# Patient Record
Sex: Female | Born: 1970 | Race: Black or African American | Hispanic: No | Marital: Single | Smoking: Never smoker
Health system: Southern US, Community
[De-identification: ages and names within clinical notes are randomized; demographics above are authoritative.]

## PROBLEM LIST (undated history)

## (undated) DIAGNOSIS — N946 Dysmenorrhea, unspecified: Secondary | ICD-10-CM

## (undated) DIAGNOSIS — A599 Trichomoniasis, unspecified: Secondary | ICD-10-CM

## (undated) DIAGNOSIS — N870 Mild cervical dysplasia: Secondary | ICD-10-CM

## (undated) DIAGNOSIS — O009 Unspecified ectopic pregnancy without intrauterine pregnancy: Secondary | ICD-10-CM

## (undated) DIAGNOSIS — A749 Chlamydial infection, unspecified: Secondary | ICD-10-CM

## (undated) DIAGNOSIS — A549 Gonococcal infection, unspecified: Secondary | ICD-10-CM

## (undated) DIAGNOSIS — N83209 Unspecified ovarian cyst, unspecified side: Secondary | ICD-10-CM

## (undated) DIAGNOSIS — B009 Herpesviral infection, unspecified: Secondary | ICD-10-CM

## (undated) HISTORY — DX: Unspecified ectopic pregnancy without intrauterine pregnancy: O00.90

## (undated) HISTORY — DX: Gonococcal infection, unspecified: A54.9

## (undated) HISTORY — PX: WISDOM TOOTH EXTRACTION: SHX21

## (undated) HISTORY — PX: TUBAL LIGATION: SHX77

## (undated) HISTORY — DX: Trichomoniasis, unspecified: A59.9

## (undated) HISTORY — DX: Unspecified ovarian cyst, unspecified side: N83.209

## (undated) HISTORY — DX: Dysmenorrhea, unspecified: N94.6

## (undated) HISTORY — DX: Chlamydial infection, unspecified: A74.9

## (undated) HISTORY — DX: Herpesviral infection, unspecified: B00.9

## (undated) HISTORY — DX: Mild cervical dysplasia: N87.0

---

## 1998-01-28 ENCOUNTER — Ambulatory Visit (HOSPITAL_COMMUNITY): Admission: RE | Admit: 1998-01-28 | Discharge: 1998-01-28 | Payer: Self-pay | Admitting: Internal Medicine

## 1999-06-01 ENCOUNTER — Other Ambulatory Visit: Admission: RE | Admit: 1999-06-01 | Discharge: 1999-06-01 | Payer: Self-pay | Admitting: Obstetrics and Gynecology

## 2000-03-14 ENCOUNTER — Ambulatory Visit (HOSPITAL_COMMUNITY): Admission: RE | Admit: 2000-03-14 | Discharge: 2000-03-14 | Payer: Self-pay | Admitting: Obstetrics & Gynecology

## 2000-03-14 ENCOUNTER — Encounter: Payer: Self-pay | Admitting: Obstetrics & Gynecology

## 2000-05-16 ENCOUNTER — Other Ambulatory Visit: Admission: RE | Admit: 2000-05-16 | Discharge: 2000-05-16 | Payer: Self-pay | Admitting: Obstetrics and Gynecology

## 2001-06-19 ENCOUNTER — Other Ambulatory Visit: Admission: RE | Admit: 2001-06-19 | Discharge: 2001-06-19 | Payer: Self-pay | Admitting: Obstetrics and Gynecology

## 2002-03-27 ENCOUNTER — Other Ambulatory Visit: Admission: RE | Admit: 2002-03-27 | Discharge: 2002-03-27 | Payer: Self-pay | Admitting: Obstetrics and Gynecology

## 2002-06-01 ENCOUNTER — Encounter: Payer: Self-pay | Admitting: Obstetrics and Gynecology

## 2002-06-01 ENCOUNTER — Ambulatory Visit (HOSPITAL_COMMUNITY): Admission: RE | Admit: 2002-06-01 | Discharge: 2002-06-01 | Payer: Self-pay | Admitting: Obstetrics and Gynecology

## 2002-08-01 ENCOUNTER — Encounter: Payer: Self-pay | Admitting: Obstetrics and Gynecology

## 2002-08-01 ENCOUNTER — Ambulatory Visit (HOSPITAL_COMMUNITY): Admission: RE | Admit: 2002-08-01 | Discharge: 2002-08-01 | Payer: Self-pay | Admitting: Obstetrics and Gynecology

## 2002-10-09 ENCOUNTER — Inpatient Hospital Stay (HOSPITAL_COMMUNITY): Admission: AD | Admit: 2002-10-09 | Discharge: 2002-10-11 | Payer: Self-pay | Admitting: Obstetrics and Gynecology

## 2003-01-15 ENCOUNTER — Ambulatory Visit (HOSPITAL_COMMUNITY): Admission: RE | Admit: 2003-01-15 | Discharge: 2003-01-15 | Payer: Self-pay | Admitting: Internal Medicine

## 2003-03-27 ENCOUNTER — Other Ambulatory Visit: Admission: RE | Admit: 2003-03-27 | Discharge: 2003-03-27 | Payer: Self-pay | Admitting: Obstetrics and Gynecology

## 2004-03-26 ENCOUNTER — Other Ambulatory Visit: Admission: RE | Admit: 2004-03-26 | Discharge: 2004-03-26 | Payer: Self-pay | Admitting: Obstetrics and Gynecology

## 2005-04-27 ENCOUNTER — Other Ambulatory Visit: Admission: RE | Admit: 2005-04-27 | Discharge: 2005-04-27 | Payer: Self-pay | Admitting: Obstetrics and Gynecology

## 2006-08-03 ENCOUNTER — Other Ambulatory Visit: Admission: RE | Admit: 2006-08-03 | Discharge: 2006-08-03 | Payer: Self-pay | Admitting: Obstetrics and Gynecology

## 2006-09-02 ENCOUNTER — Ambulatory Visit (HOSPITAL_COMMUNITY): Admission: RE | Admit: 2006-09-02 | Discharge: 2006-09-02 | Payer: Self-pay | Admitting: Obstetrics and Gynecology

## 2007-01-17 ENCOUNTER — Inpatient Hospital Stay (HOSPITAL_COMMUNITY): Admission: AD | Admit: 2007-01-17 | Discharge: 2007-01-19 | Payer: Self-pay | Admitting: Obstetrics and Gynecology

## 2007-01-17 ENCOUNTER — Encounter (INDEPENDENT_AMBULATORY_CARE_PROVIDER_SITE_OTHER): Payer: Self-pay | Admitting: Specialist

## 2010-06-30 ENCOUNTER — Ambulatory Visit (HOSPITAL_COMMUNITY): Admission: RE | Admit: 2010-06-30 | Discharge: 2010-06-30 | Payer: Self-pay | Admitting: Internal Medicine

## 2010-10-26 ENCOUNTER — Emergency Department: Payer: Self-pay | Admitting: Emergency Medicine

## 2010-10-26 ENCOUNTER — Ambulatory Visit (HOSPITAL_COMMUNITY): Admission: AD | Admit: 2010-10-26 | Discharge: 2010-10-27 | Payer: Self-pay | Admitting: Obstetrics and Gynecology

## 2010-10-27 ENCOUNTER — Encounter (INDEPENDENT_AMBULATORY_CARE_PROVIDER_SITE_OTHER): Payer: Self-pay | Admitting: Obstetrics and Gynecology

## 2011-01-17 ENCOUNTER — Encounter: Payer: Self-pay | Admitting: Internal Medicine

## 2011-05-14 NOTE — Discharge Summary (Signed)
NAMEEMMELY, BITTINGER             ACCOUNT NO.:  [id number]   MEDICAL RECORD NO.:  [id number]          PATIENT TYPE:  INP   LOCATION:  9146                          FACILITY:  [facility]   PHYSICIAN:  Hal Morales, M.D.DATE OF BIRTH:  07/13/1971   DATE OF ADMISSION:  01/17/2007  DATE OF DISCHARGE:  01/19/2007                               DISCHARGE SUMMARY   ADMISSION DIAGNOSES:  1. Intrauterine pregnancy at term.  2. Advanced active labor in second stage.  3. 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  4. PENICILLIN ALLERGY.  5. Desires sterilization.   DISCHARGE DIAGNOSES:  1. Multiparity.  2. Desires tubal sterilization.  3. Intrauterine pregnancy at term.  4. Advanced active labor in second stage.  5. 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  6. PENICILLIN ALLERGY.   PROCEDURES:  1. Normal spontaneous vaginal birth.  2. Postpartum tubal ligation.  3. General anesthesia.   HOSPITAL COURSE:  Ms. Donoso is a 40 year old gravida 5, para 1-0-3-1 at  38-4/7 weeks who presented in advanced labor on the morning of January 17, 2007.  She was completely dilated following spontaneous rupture of  membranes at home at 6:30.  She had been taking Valtrex prophylaxis for  HSV-2 lesions.  No recent or current lesions or prodrome.  Her pregnancy  had been remarkable for (1) advanced maternal age, (2) PENICILLIN  ALLERGY, (3) abnormal Pap and cryoablation, (4) history of HSV-2 on  Valtrex prophylaxis, (5) desires sterilization, (6) group B strep  negative.   On admission, the cervix was completely dilated, completely effaced,  with the vertex at a 0 station.  Fetal heart rate was reactive.  Contractions were every 2 to 3 minutes.  The patient then progressed  rapidly to delivery of a viable female by the name of Ladona Ridgel.  Weight 6  pounds 12 ounces, Apgars 9 and 9.  The patient had no lacerations noted.  The infant  was taken to the full-term nursery.  Mother was taken to  recovery in good condition.   The patient did desire a tubal.  She was made n.p.o. after midnight.  On  the morning of January 18, 2007, Dr. Jaymes Graff performed a  postpartum tubal ligation under general anesthesia.  The patient  tolerated the procedure well and was taken to recovery in good  condition.   By postpartum day #2, postoperative day #1, the patient was doing well.  She was up ad lib.  She was tolerating a regular diet.  She was having  minimal pain from her tubal incision.  Her bleeding was within normal  limits.  Her hemoglobin on day #1 was 10.6.  It had not been done prior  to delivery secondary to her advanced state of labor.  White blood cell  count was 12.7, and platelet count was 186.   The patient was deemed to have received full benefit from her hospital  stay and was discharged home.   DISCHARGE INSTRUCTIONS:  Per [facility]  handout.   DISCHARGE MEDICATIONS:  1. Motrin 600 mg p.o. q.6h. p.r.n. pain.  2. Tylox one to two p.o. q.3-4h. p.r.n. pain.   FOLLOW UP:  Discharge followup will occur in 6 weeks at [facility].   CONDITION ON DISCHARGE:  Stable.      Renaldo Reel Emilee Hero, C.N.M.      Hal Morales, M.D.  Electronically Signed    VLL/MEDQ  D:  01/19/2007  T:  01/19/2007  Job:  [id number]

## 2011-05-14 NOTE — H&P (Signed)
NAMEHOLY, BATTENFIELD NO.:  [id number]   MEDICAL RECORD NO.:  [id number]          PATIENT TYPE:  INP   LOCATION:  9170                          [facility]ACILITY:  [facility]   PHYSICIAN:  Osborn Coho, M.D.   DATE OF BIRTH:  1971-06-28   DATE OF ADMISSION:  01/17/2007  DATE OF DISCHARGE:                              HISTORY & PHYSICAL   Yolanda Higgins is a 40 year old gravida 5, para 1-0-3-1 who presents at 77-  4/7 weeks, EDD January 27, 2007.  She presents in advanced active labor,  completely dilated following spontaneous rupture of membranes at home  for clear fluid at 6:30 this morning.  Contractions became regular and  intense following spontaneous rupture of membranes.  She reports  positive fetal movement.  No vaginal bleeding.  She denies any headache,  visual changes or epigastric pain.  She has been taking Valtrex  prophylactically for history of HSV II with no current lesions or  prodrome.  Her pregnancy has been followed by the CNM service at [facility]  and is remarkable for:  1. Advanced maternal age.  2. Penicillin allergy.  3. History of abnormal Pap and cryo.  4. History of HSV II.  5. Group B strep negative.  6. Desires sterilization.   This patient began prenatal care at the office of [facility] on August 30, 2006, at approximately 18 weeks' gestation.  EDC determined by 14-week  ultrasound and confirmed with followup.  Ms. Daleo pregnancy has been  essentially unremarkable.  She underwent amnio at 18 weeks for normal  female.  She has been size equal to dates throughout, normotensive with  no proteinuria.   PRENATAL LAB WORK:  Hemoglobin and hematocrit 10.6 and 31.8, platelets  244,000.  Blood type and Rh O positive, antibody screen negative, sickle  cell trait negative, VDRL nonreactive, rubella immune, hepatitis B  surface antigen negative, HIV nonreactive.  Pap smear within normal  limits.  GC and chlamydia negative.  CF testing negative.  Quad screen  within normal limits at 28 weeks.  One-hour glucose challenge 125,  hemoglobin 10.7, RPR nonreactive.  A 36-week culture of the vaginal  tract was negative for group B strep.   OB HISTORY:  In 1992, the patient had a first trimester elective AB with  no complications.  In 1994, the patient had a first trimester SAB with a  D&C, no complications.  In October 2003, the patient had a spontaneous  vaginal delivery with the birth of a 6 pound 9 ounce female infant named  Joselyn Glassman with no complications.  In 2006, she had a first trimester SAB  with no complications.  This is her fifth and current pregnancy.   MEDICAL HISTORY:  Significant for abnormal Pap and cryosurgery in 1991,  mitral valve prolapse with no regurgitation, no antibiotics, history of  migraines, history of HSV II.   SURGICAL HISTORY:  Wisdom teeth, elective AB in 1992 and D&C in 1994.   FAMILY HISTORY:  Maternal grandmother with a history of chronic  hypertension.  The patient's mother with a history of thyroid disease.  Paternal grandmother seizure disorder.  Paternal aunt with breast  cancer.  Paternal grandmother depression.   GENETIC HISTORY:  The patient's mother with a history of sickle cell  trait.  Otherwise there is no family history of children that died with  any birth defects or any that died in infancy.   The patient has allergies to PENICILLIN AND SUDAFED.  She denies the use  of tobacco, alcohol or illicit drugs with her pregnancy.  Her height is  5 feet 4 inches.  Pre gravid weight is 117.  Last recorded prenatal  weight 148.   SOCIAL HISTORY:  Ms. Sciarra is a single African-American female.  Her  partner Grace Bushy is involved and supportive.  They are Methodist in  their faith.   REVIEW OF SYSTEMS:  As described above.  The patient is typical of one  with a uterine pregnancy at term in advanced active labor.   PHYSICAL EXAM:  VITAL SIGNS:  Stable.  The patient is afebrile.  HEENT:  Unremarkable.   HEART:  Regular rate and rhythm.  LUNGS:  Clear.  ABDOMEN:  Gravid in its contour.  Uterine fundus is noted to extend 38  cm above the level of the pubic symphysis.  Leopold's maneuvers finds  the infant to be in longitudinal lie, cephalic presentation and the  estimated fetal weight is 7 pounds.  Baseline of fetal heart rate  monitor is 140s with average long-term variability.  Accelerations and  fetal heart rate are noted with no decelerations.  PELVIC:  The patient is contracting every 2-3 minutes.  Digital exam of  her cervix finds it to be completely dilated, completely effaced with  the cephalic presenting part at a zero station.  Spontaneous rupture of  membranes had occurred, clear fluid is noted.  No HSV lesions are noted.  EXTREMITIES:  Show no pathologic edema.  DTRs are 1+ with no clonus.  There is no calf tenderness noted bilaterally.   ASSESSMENT:  1. Intrauterine pregnancy at term.  2. Advanced active labor second stage.   PLAN:  Admit per Dr. Osborn Coho.  Anticipate spontaneous vaginal  delivery.      Rica Koyanagi, C.N.M.      Osborn Coho, M.D.  Electronically Signed    SDM/MEDQ  D:  01/17/2007  T:  01/17/2007  Job:  [id number]

## 2011-05-14 NOTE — Op Note (Signed)
Yolanda Higgins, Yolanda Higgins             ACCOUNT NO.:  [id number]   MEDICAL RECORD NO.:  [id number]          PATIENT TYPE:  INP   LOCATION:  9146                          [facility]ACILITY:  [facility]   PHYSICIAN:  Naima A. Dillard, M.D. DATE OF BIRTH:  25-Nov-1971   DATE OF PROCEDURE:  01/18/2007  DATE OF DISCHARGE:                               OPERATIVE REPORT   PREOPERATIVE DIAGNOSES:  Multiparity, desires tubal ligation.   POSTOPERATIVE DIAGNOSES:  Multiparity, desires tubal ligation.   PROCEDURE:  Postpartum tubal ligation.   SURGEON:  Naima A. Normand Sloop, M.D.   ANESTHESIA:  General.   COMPLICATIONS:  None.   FINDINGS:  Normal abdominal anatomy, normal appearing bowel, normal  appearing tubes bilaterally. A portion of right and left tube sent to  pathology for evaluation.   ESTIMATED BLOOD LOSS:  Minimal.   DESCRIPTION OF PROCEDURE:  Before the procedure, the patient understood  the risks to be but not limited to bleeding, infection, damage to  internal organs such as bowel, bladder, major blood vessels and a  failure rate of the tubal ligation about 1/200 to 1/300 with half of  those resulting in ectopic pregnancy. The patient understood and decided  to proceed. She was taken to the operating room, given general  anesthesia, prepped and draped in a normal sterile fashion. The bladder  was drained. 5 mL of 0.25% Marcaine was placed in the infraumbilical  area. About a 1 cm infraumbilical incision was made with the scalpel and  carried down to the fascia. The fascia was incised in the midline and  extended bilaterally. The peritoneum was identified, tented up and  entered up sharply. The patient's right fallopian tube was grasped with  a Babcock followed out to the fimbriated end and about a centimeter of  the mid isthmic portion of the tube was ligated with 2-0 plain and  excised. Hemostasis was noted. The patient's left fallopian tube was  grasped with Babcock clamps and followed out to the  fimbriated end.  About a centimeter of the mid isthmic portion of the tube was ligated  with 2-0 plain and excised. Hemostasis was noted. The tubes were  returned back to the abdomen. The fascia was repaired with #0 Vicryl.  The skin was repaired with 3-0 Monocryl in a subcuticular fashion.  Sponge, lap and needle counts were correct. The patient went to the  recovery room in stable condition.      Naima A. Normand Sloop, M.D.  Electronically Signed    NAD/MEDQ  D:  01/18/2007  T:  01/18/2007  Job:  [id number]

## 2011-05-14 NOTE — H&P (Signed)
NAME:  Yolanda Higgins, Yolanda Higgins                       ACCOUNT NO.:  [id number]   MEDICAL RECORD NO.:  [id number]                   PATIENT TYPE:  INP   LOCATION:  9171                                 [facility]ILITY[facility]   PHYSICIAN:  Yolanda Higgins, M.D.             DATE OF BIRTH:  1971-02-12   DATE OF ADMISSION:  10/09/2002  DATE OF DISCHARGE:                                HISTORY & PHYSICAL   HISTORY OF PRESENT ILLNESS:  The patient is a 40 year old gravida 3, para 0-  0-2-0 at 70 and 1/7 weeks, EDD October 29, 2002 by early pregnancy  ultrasonography at 9 weeks 1 day.  Follow-up ultrasounds have been  consistent with growth and development.  On ultrasound at 18 weeks 3 days  patient was noted to have a low lying placenta and a possible vaso previa.  Doppler studies were obtained at [facility] which showed  a low lying placenta with inferior margin approximately 7 mm from the  internal os and no evidence of vaso previa.  On follow-up ultrasound  placenta had entirely cleared from the cervical os.  The patient was noted  at 27 weeks to have a dynamic cervix from 1.2 mm to 2.0 cm.  She has  remained on bed rest.  Follow-up ultrasounds at 30 weeks and at 34 weeks  found cervix to be stable at 2.85 cm.  Again, growth has been normal.  Fluid  has remained normal.  The patient remained on bed rest until 36 weeks of  pregnancy.  She was not on any tocolytic.  At 36 weeks her activities were  returned to normal and she presents today at 37 weeks in labor.  Her  pregnancy has been followed by the M.D. service at [facility] and is remarkable  for questionable LMP, irregular cycles, history of abnormal Pap and cryo,  history of HSV.  She is group B Strep negative.  Her pregnancy was initially  evaluated at the office of [facility] on March 27, 2002 at [redacted] weeks gestation.  Pregnancy is as described above.  She has remained normotensive with no  proteinuria throughout her pregnancy.  Prenatal  laboratory work on March 27, 2002:  Hemoglobin and hematocrit 12.1 and 35.1, platelets 240,000.  Blood  type and Rh O+.  Antibody screen negative.  Sickle cell trait negative.  VDRL reactive ATPPA and negative.  Rubella immune.  Hepatitis C surface  antigen negative.  HIV nonreactive.  Pap smear within normal limits.  GC and  Chlamydia negative.  AFP/free beta hCG within normal range.  At 28 weeks one  hour glucose challenge 104 and hemoglobin 11.1.  At 36 weeks culture of the  vaginal tract is negative for group B Strep.   OB HISTORY:  In 1992 elective AB with no complications.  In 1994 spontaneous  AB with D&C and no complications.  The present pregnancy.   PAST MEDICAL HISTORY:  The patient does have a history of HSV.  She has not  experienced any outbreaks throughout this pregnancy.  She does not have any  prodrome and does not notice any lesions.  She began Valtrex 500 mg p.o.  q.d. on October 02, 2002 and sterile speculum examination today finds no  external and no internal HSV lesions.  She has a history of abnormal Pap and  cryo.  She has a history of mitral valve prolapse with no prophylactic  antibiotics.  She fractured her left elbow as a teenager and had wisdom  teeth removed in 1993.  She does have a history of migraine headaches.   FAMILY HISTORY:  The patient's mother with a history of thyroid disease.  Maternal grandmother with history of chronic hypertension.  Paternal aunt  with breast cancer.  Paternal grandmother CVA.  Paternal grandmother with a  history of depression.   GENETIC HISTORY:  The patient's niece with a heart murmur.  The patient's  mother is a twin.  Father of infant's mother had twins which died.  The  patient's mother has a history of sickle cell trait.   SOCIAL HISTORY:  The patient is a single African-American female.  The  father of the baby, Lake Bells, is involved and supportive.  The  patient's mother is with the patient at the present time  supporting her.  She denies the use of tobacco, alcohol, or illicit drugs.   ALLERGIES:  SUDAFED which caused her to faint.  Otherwise, there are no  known drug allergies.   REVIEW OF SYMPTOMS:  As described above.  The patient is typical of one with  a uterine pregnancy at term in early active labor.   PHYSICAL EXAMINATION:  VITAL SIGNS:  Stable, afebrile.  HEENT:  Unremarkable.  HEART:  Regular rate and rhythm.  LUNGS:  Clear.  ABDOMEN:  Gravid in its contour.  Uterine fundus is noted to extend 37 cm  above the level of the pubic symphysis.  Leopold's maneuvers finds the  infant to be in a longitudinal lie, cephalic presentation.  The estimated  fetal weight is 6.5 pounds.  Fetal heart rate is reassuring by Doppler.  The  patient is contracting regularly.  PELVIC:  Sterile speculum examination finds no external and no internal HSV  lesions.  Digital examination of the cervix finds it to be 5 cm dilated, 90%  effaced with the cephalic presenting part at a -1 station and a bulging bag  of water.  EXTREMITIES:  The patient has no pathologic edema.  DTRs are 1+ with no  clonus.   ASSESSMENT:  Intrauterine pregnancy at term in early active labor.   PLAN:  Admit per Dr. Dierdre Forth.  Routine M.D. orders.     Rica Koyanagi, C.N.M.               Yolanda Higgins, M.D.    SDM/MEDQ  D:  10/09/2002  T:  10/09/2002  Job:  [id number]

## 2011-09-29 ENCOUNTER — Other Ambulatory Visit: Payer: Self-pay | Admitting: Obstetrics and Gynecology

## 2011-09-29 DIAGNOSIS — Z1231 Encounter for screening mammogram for malignant neoplasm of breast: Secondary | ICD-10-CM

## 2011-10-14 ENCOUNTER — Ambulatory Visit
Admission: RE | Admit: 2011-10-14 | Discharge: 2011-10-14 | Disposition: A | Discharge status: Home / Self Care | Payer: 59 | Source: Ambulatory Visit | Attending: Obstetrics and Gynecology | Admitting: Obstetrics and Gynecology

## 2011-10-14 DIAGNOSIS — Z1231 Encounter for screening mammogram for malignant neoplasm of breast: Secondary | ICD-10-CM

## 2012-09-26 ENCOUNTER — Ambulatory Visit (INDEPENDENT_AMBULATORY_CARE_PROVIDER_SITE_OTHER): Payer: 59 | Admitting: Obstetrics and Gynecology

## 2012-09-26 ENCOUNTER — Encounter: Payer: Self-pay | Admitting: Obstetrics and Gynecology

## 2012-09-26 VITALS — BP 102/62 | Ht 64.0 in | Wt 120.0 lb

## 2012-09-26 DIAGNOSIS — Z309 Encounter for contraceptive management, unspecified: Secondary | ICD-10-CM

## 2012-09-26 DIAGNOSIS — Z8759 Personal history of other complications of pregnancy, childbirth and the puerperium: Secondary | ICD-10-CM

## 2012-09-26 DIAGNOSIS — Z124 Encounter for screening for malignant neoplasm of cervix: Secondary | ICD-10-CM

## 2012-09-26 DIAGNOSIS — Z8742 Personal history of other diseases of the female genital tract: Secondary | ICD-10-CM

## 2012-09-26 LAB — RPR

## 2012-09-26 MED ORDER — VALACYCLOVIR HCL 500 MG PO TABS: 500.0000 mg | ORAL_TABLET | Freq: Two times a day (BID) | ORAL | Status: AC

## 2012-09-26 NOTE — Progress Notes (Signed)
AEX  Last Pap: 09/29/2011 WNL: Yes NO hx high grade lesions Regular Periods:yes Contraception: BTL  Monthly Breast exam:yes Tetanus<60yrs:no Nl.Bladder Function:yes Daily BMs:yes Healthy Diet:yes Calcium:no Mammogram:yes Date of Mammogram: 10/14/2011 Exercise:yes Have often Exercise: 1 times weekly Seatbelt: yes Abuse at home: no Stressful work:no Sigmoid-colonoscopy: N/A Bone Density: No PCP: Dr. Chestine Spore Change in PMH: None Change in [facility]: None  Subjective:    Yolanda Higgins is a 41 y.o. female, G5P2, who presents for an annual exam.     History   Social History  . Marital Status: Single    Spouse Name: N/A    Number of Children: N/A  . Years of Education: N/A   Social History Main Topics  . Smoking status: Never Smoker   . Smokeless tobacco: Never Used  . Alcohol Use: No  . Drug Use: No  . Sexually Active: Yes    Birth Control/ Protection: Surgical     BTL   Other Topics Concern  . None   Social History Narrative  . None    Menstrual cycle:   LMP: Patient's last menstrual period was 09/21/2012.           Cycle: regular for 5 days.  No heavy bleeding.  Rare cramps.  No IM bleeding  The following portions of the patient's history were reviewed and updated as appropriate: allergies, current medications, past family history, past medical history, past social history, past surgical history and problem list.  Review of Systems Pertinent items are noted in HPI. Breast:Negative for breast lump,nipple discharge or nipple retraction Gastrointestinal: Negative for abdominal pain, change in bowel habits or rectal bleeding Urinary:negative   Objective:    BP 102/62  Ht 5\' 4"  (1.626 m)  Wt 120 lb (54.432 kg)  BMI 20.60 kg/m2  LMP 09/21/2012    Weight:  Wt Readings from Last 1 Encounters:  09/26/12 120 lb (54.432 kg)          BMI: Body mass index is 20.60 kg/(m^2).  General Appearance: Alert, appropriate appearance for age. No acute distress HEENT:  Grossly normal Neck / Thyroid: Supple, no masses, nodes or enlargement Lungs: clear to auscultation bilaterally Back: No CVA tenderness Breast Exam: No masses or nodes.No dimpling, nipple retraction or discharge. Cardiovascular: Regular rate and rhythm. S1, S2, no murmur Gastrointestinal: Soft, non-tender, no masses or organomegaly Pelvic Exam: Vulva and vagina appear normal. Bimanual exam reveals normal uterus and adnexa. Rectovaginal: normal rectal, no masses Lymphatic Exam: Non-palpable nodes in neck, clavicular, axillary, or inguinal regions Skin: no rash or abnormalities Neurologic: Normal gait and speech, no tremor  Psychiatric: Alert and oriented, appropriate affect.   Wet Prep:not applicable Urinalysis:not applicable UPT: Not done   Assessment:    Normal gyn exam  Hx ectopic  Hx HSV II with rare outbreaks Plan:    mammogram pap smear return annually or prn STD screening: done, GC, chl, HIV, RPR all per pt request  Contraception:bilateral tubal ligation      Dierdre Forth MD

## 2012-09-27 ENCOUNTER — Other Ambulatory Visit: Payer: Self-pay | Admitting: Obstetrics and Gynecology

## 2012-09-27 DIAGNOSIS — Z1231 Encounter for screening mammogram for malignant neoplasm of breast: Secondary | ICD-10-CM

## 2012-09-28 LAB — PAP IG, CT-NG NAA, HPV HIGH-RISK

## 2012-09-29 ENCOUNTER — Encounter: Payer: Self-pay | Admitting: Obstetrics and Gynecology

## 2012-10-16 ENCOUNTER — Ambulatory Visit: Payer: 59

## 2012-11-16 ENCOUNTER — Ambulatory Visit
Admission: RE | Admit: 2012-11-16 | Discharge: 2012-11-16 | Disposition: A | Discharge status: Home / Self Care | Payer: 59 | Source: Ambulatory Visit | Attending: Obstetrics and Gynecology | Admitting: Obstetrics and Gynecology

## 2012-11-16 DIAGNOSIS — Z1231 Encounter for screening mammogram for malignant neoplasm of breast: Secondary | ICD-10-CM

## 2012-12-01 ENCOUNTER — Emergency Department (HOSPITAL_COMMUNITY)
Admission: EM | Admit: 2012-12-01 | Discharge: 2012-12-01 | Disposition: A | Discharge status: Home / Self Care | Payer: 59 | Source: Home / Self Care | Attending: Family Medicine | Admitting: Family Medicine

## 2012-12-01 ENCOUNTER — Encounter (HOSPITAL_COMMUNITY): Payer: Self-pay | Admitting: Emergency Medicine

## 2012-12-01 DIAGNOSIS — J069 Acute upper respiratory infection, unspecified: Secondary | ICD-10-CM

## 2012-12-01 LAB — POCT RAPID STREP A: Streptococcus, Group A Screen (Direct): NEGATIVE

## 2012-12-01 MED ORDER — AZITHROMYCIN 250 MG PO TABS: ORAL_TABLET | ORAL | Status: DC

## 2012-12-01 MED ORDER — IPRATROPIUM BROMIDE 0.06 % NA SOLN: 2.0000 | Freq: Four times a day (QID) | NASAL | Status: DC

## 2012-12-01 MED ORDER — DEXTROMETHORPHAN POLISTIREX 30 MG/5ML PO LQCR: 60.0000 mg | ORAL | Status: DC | PRN

## 2012-12-01 NOTE — ED Provider Notes (Signed)
History     CSN: [id number]  Arrival date & time 12/01/12  4098   First MD Initiated Contact with Patient 12/01/12 1019      Chief Complaint  Patient presents with  . URI    (Consider location/radiation/quality/duration/timing/severity/associated sxs/prior treatment) Patient is a 41 y.o. female presenting with URI. The history is provided by the patient.  URI The primary symptoms include sore throat and cough. Primary symptoms do not include fever, nausea or vomiting. The current episode started 2 days ago. This is a new problem. The problem has not changed since onset. Symptoms associated with the illness include congestion and rhinorrhea.    Past Medical History  Diagnosis Date  . Ovarian cyst   . Ectopic pregnancy   . HSV-2 infection   . Dysmenorrhea   . Gonorrhea   . Chlamydia   . Trichimoniasis   . Dysplasia of cervix, low grade (CIN 1)     Past Surgical History  Procedure Date  . Tubal ligation   . Wisdom tooth extraction     Family History  Problem Relation Age of Onset  . Cancer Mother   . Hypertension Father   . Stroke Father     History  Substance Use Topics  . Smoking status: Never Smoker   . Smokeless tobacco: Never Used  . Alcohol Use: No    OB History    Grav Para Term Preterm Abortions TAB SAB Ect Mult Living   5 2        2       Review of Systems  Constitutional: Negative.  Negative for fever.  HENT: Positive for congestion, sore throat and rhinorrhea.   Respiratory: Positive for cough.   Gastrointestinal: Negative.  Negative for nausea and vomiting.  Genitourinary: Negative.     Allergies  Ciprofloxacin; Penicillins; and Sudafed  Home Medications   Current Outpatient Rx  Name  Route  Sig  Dispense  Refill  . AZITHROMYCIN 250 MG PO TABS      Take as directed on pack   6 each   0   . CALCIUM CARBONATE ANTACID 500 MG PO CHEW   Oral   Chew 1 tablet by mouth daily.         Marland Kitchen DEXTROMETHORPHAN POLISTIREX ER 30 MG/5ML PO  LQCR   Oral   Take 10 mLs (60 mg total) by mouth as needed for cough.   89 mL   0   . IPRATROPIUM BROMIDE 0.06 % NA SOLN   Nasal   Place 2 sprays into the nose 4 (four) times daily.   15 mL   1   . VALACYCLOVIR HCL 500 MG PO TABS   Oral   Take 1 tablet (500 mg total) by mouth 2 (two) times daily.   30 tablet   11     BP 124/66  Pulse 89  Temp 99 F (37.2 C) (Oral)  Resp 16  SpO2 98%  LMP 11/16/2012  Physical Exam  Nursing note and vitals reviewed. Constitutional: She is oriented to person, place, and time. She appears well-developed and well-nourished.  HENT:  Head: Normocephalic.  Right Ear: External ear normal.  Left Ear: External ear normal.  Mouth/Throat: Oropharynx is clear and moist.  Eyes: Pupils are equal, round, and reactive to light.  Neck: Normal range of motion. Neck supple.  Cardiovascular: Normal rate, regular rhythm, normal heart sounds and intact distal pulses.   Pulmonary/Chest: Effort normal and breath sounds normal.  Abdominal: Soft. Bowel sounds are  normal.  Lymphadenopathy:    She has no cervical adenopathy.  Neurological: She is alert and oriented to person, place, and time.  Skin: Skin is warm and dry.    ED Course  Procedures (including critical care time)   Labs Reviewed  POCT RAPID STREP A ([facility] CARE ONLY)   No results found.   1. URI (upper respiratory infection)       MDM          Linna Hoff, MD 12/01/12 1048

## 2012-12-01 NOTE — ED Notes (Signed)
Pt c/o cold sx x2 days... Began w/a sore throat, now sx include: dry cough, nasal/chest congestion, wheezing that began this am... Denies: fevers, vomiting, nauseas, diarrhea... She is alert w/no signs of acute distress.

## 2012-12-06 ENCOUNTER — Encounter: Payer: Self-pay | Admitting: Obstetrics and Gynecology

## 2012-12-06 DIAGNOSIS — R922 Inconclusive mammogram: Secondary | ICD-10-CM | POA: Insufficient documentation

## 2012-12-06 DIAGNOSIS — R9230 Dense breasts, unspecified: Secondary | ICD-10-CM | POA: Insufficient documentation

## 2012-12-07 ENCOUNTER — Encounter: Payer: Self-pay | Admitting: Obstetrics and Gynecology

## 2013-04-12 ENCOUNTER — Other Ambulatory Visit: Payer: Self-pay | Admitting: Internal Medicine

## 2013-04-12 DIAGNOSIS — R200 Anesthesia of skin: Secondary | ICD-10-CM

## 2013-04-16 ENCOUNTER — Ambulatory Visit (HOSPITAL_COMMUNITY)
Admission: RE | Admit: 2013-04-16 | Discharge: 2013-04-16 | Disposition: A | Discharge status: Home / Self Care | Payer: BC Managed Care – PPO | Source: Ambulatory Visit | Attending: Internal Medicine | Admitting: Internal Medicine

## 2013-04-16 DIAGNOSIS — R200 Anesthesia of skin: Secondary | ICD-10-CM

## 2013-04-16 DIAGNOSIS — M51379 Other intervertebral disc degeneration, lumbosacral region without mention of lumbar back pain or lower extremity pain: Secondary | ICD-10-CM | POA: Insufficient documentation

## 2013-04-16 DIAGNOSIS — M5137 Other intervertebral disc degeneration, lumbosacral region: Secondary | ICD-10-CM | POA: Insufficient documentation

## 2014-10-28 ENCOUNTER — Encounter (HOSPITAL_COMMUNITY): Payer: Self-pay | Admitting: Emergency Medicine

## 2015-08-04 ENCOUNTER — Other Ambulatory Visit: Payer: Self-pay

## 2015-08-04 DIAGNOSIS — Z1231 Encounter for screening mammogram for malignant neoplasm of breast: Secondary | ICD-10-CM

## 2015-08-25 ENCOUNTER — Ambulatory Visit
Admission: RE | Admit: 2015-08-25 | Discharge: 2015-08-25 | Disposition: A | Discharge status: Home / Self Care | Payer: BLUE CROSS/BLUE SHIELD | Source: Ambulatory Visit

## 2015-08-25 DIAGNOSIS — Z1231 Encounter for screening mammogram for malignant neoplasm of breast: Secondary | ICD-10-CM

## 2017-07-15 IMAGING — MG MM SCREEN MAMMOGRAM BILATERAL
4 series · 4 of 4 positions shown · non-contrast
Comparison: Previous exam(s).

CLINICAL DATA: Screening.

EXAM:
DIGITAL SCREENING BILATERAL MAMMOGRAM WITH CAD

[R CC]
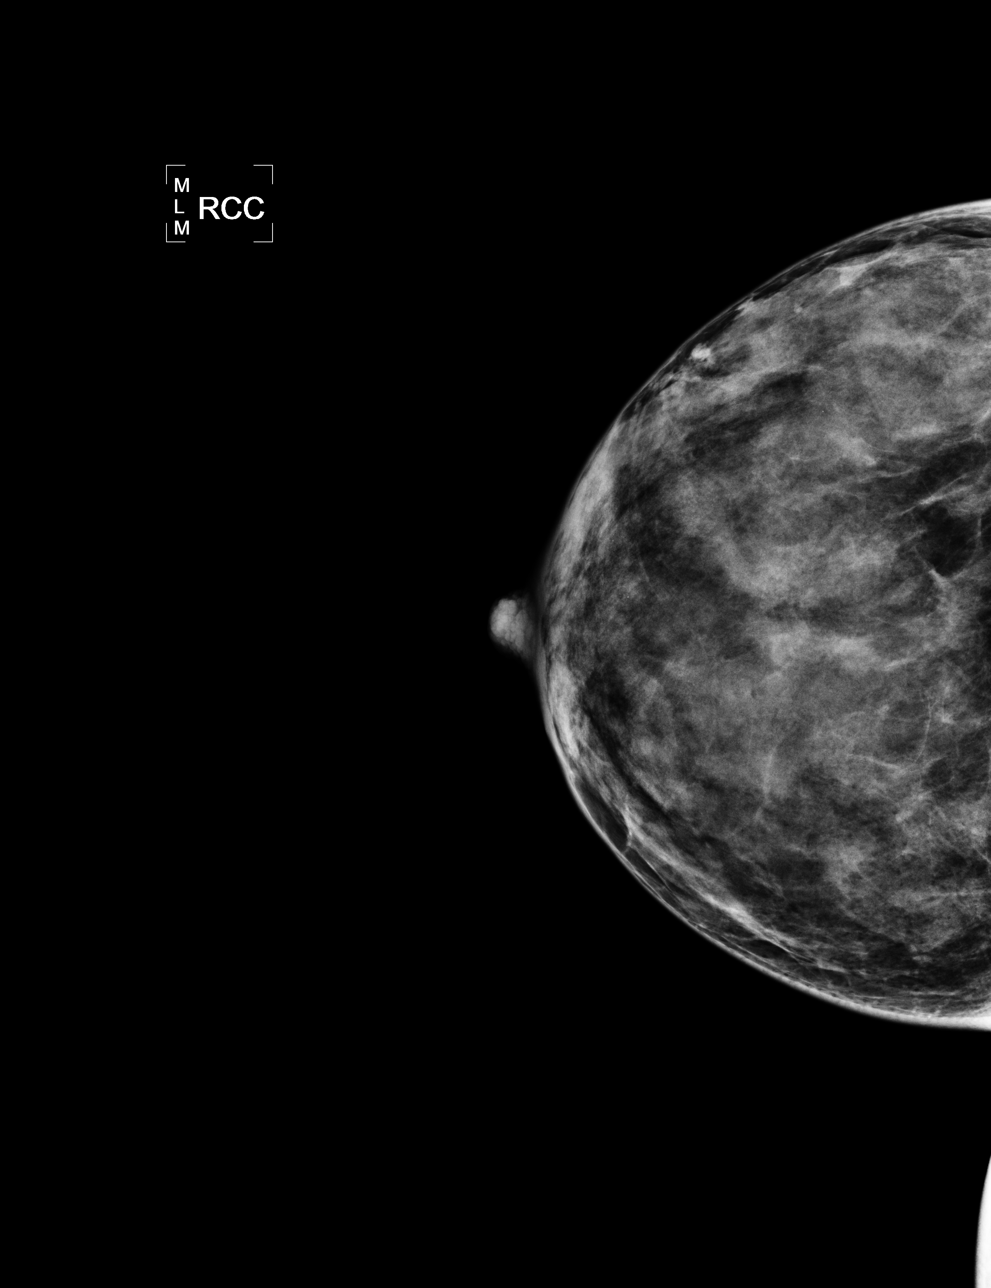

[L CC]
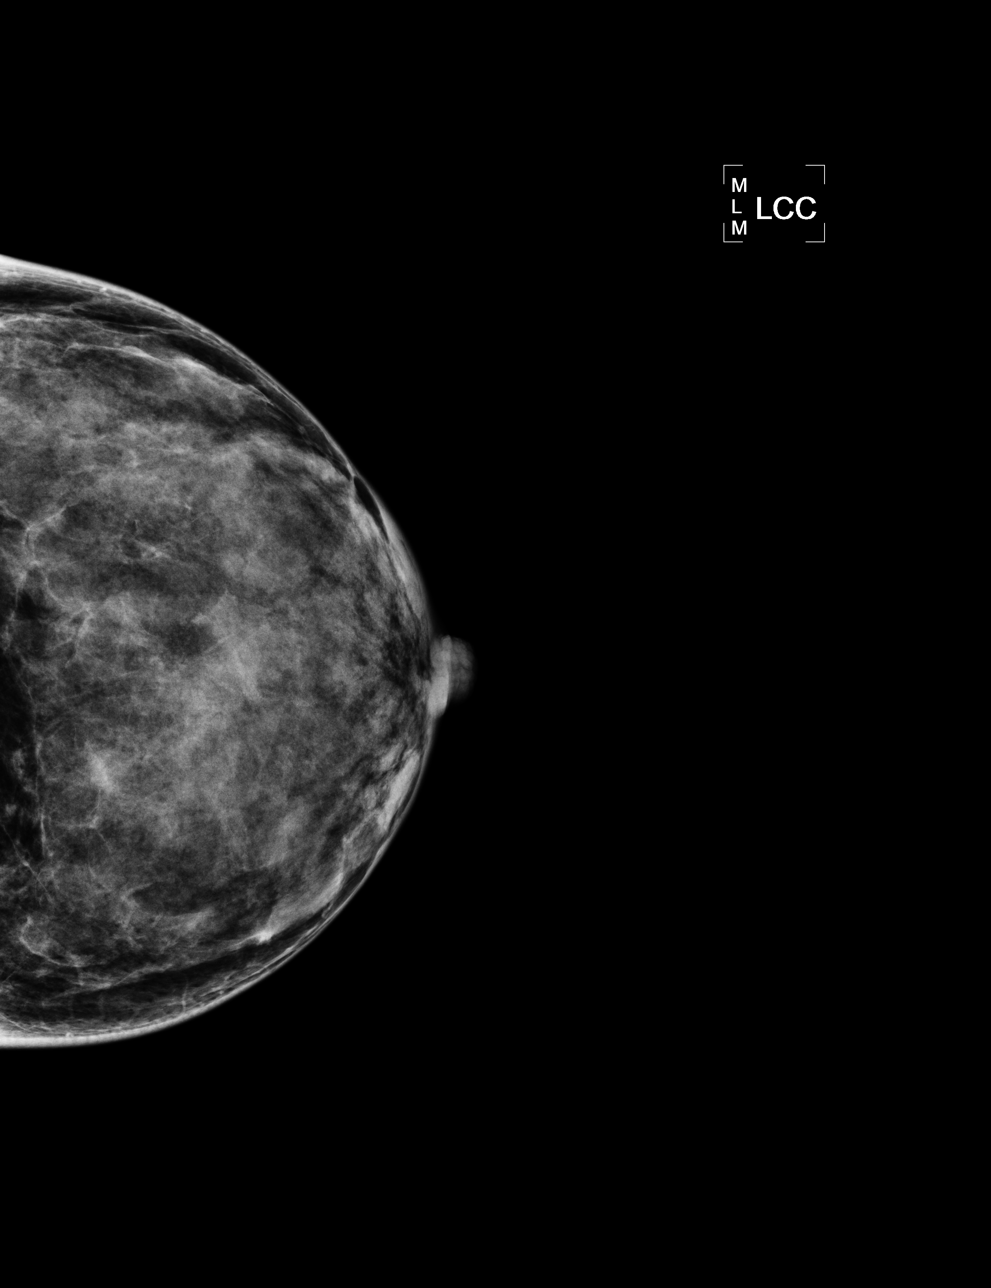

[L MLO]
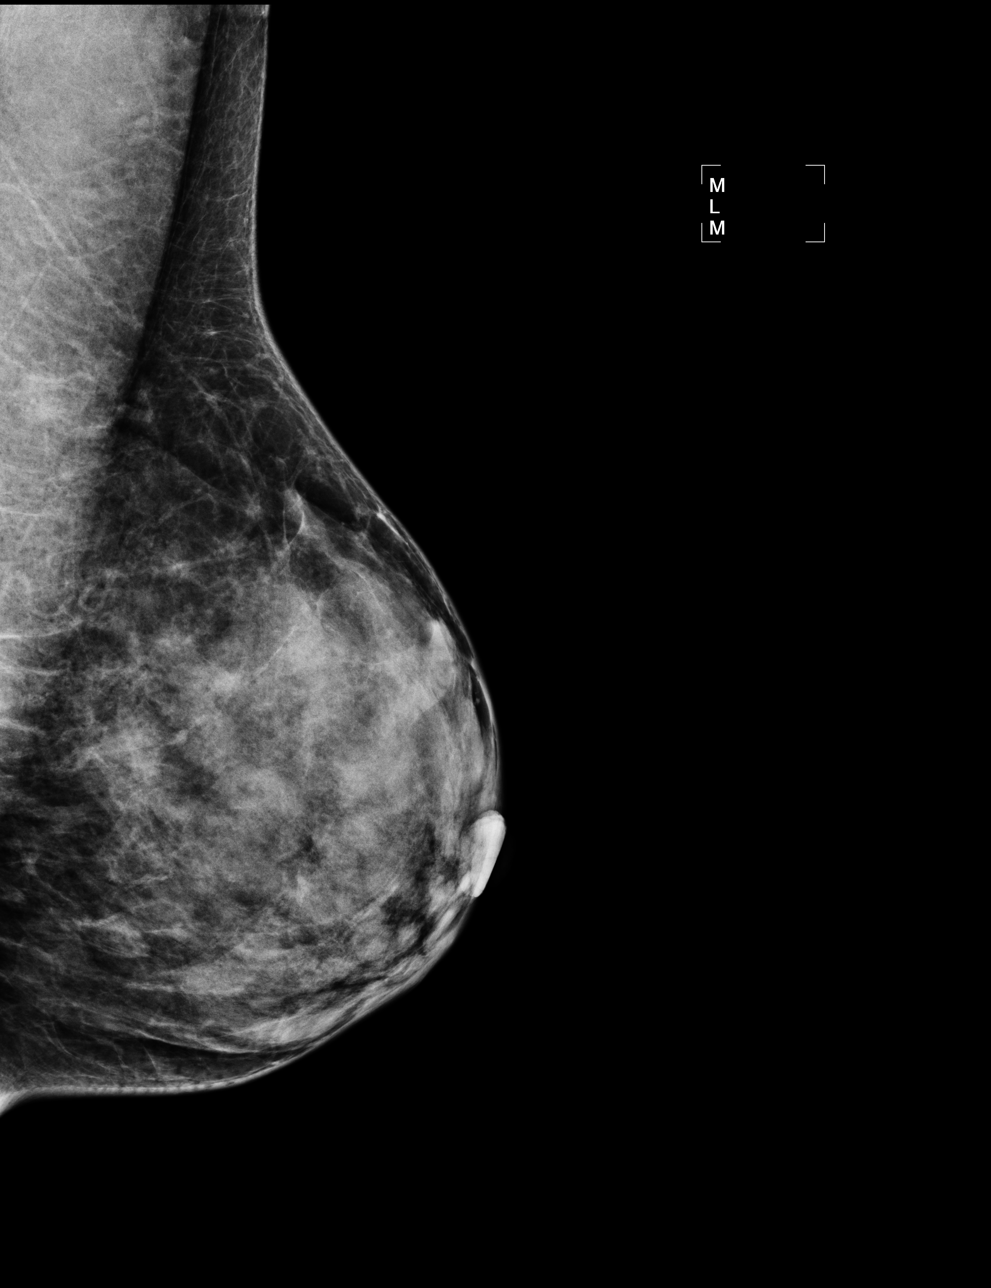

[R MLO]
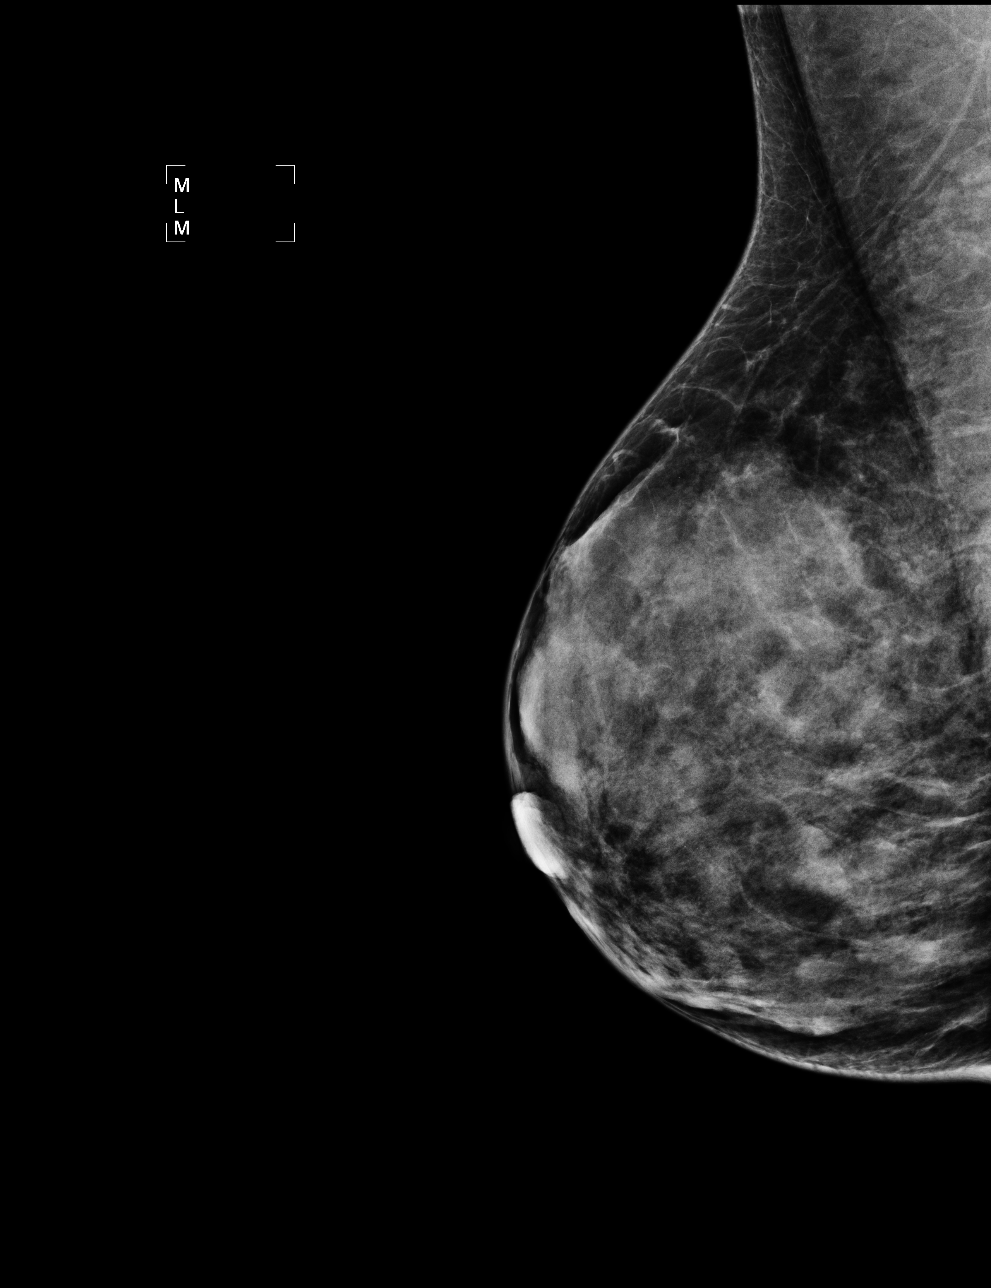

[4 of 4 positions shown; findings below may reference images not displayed]

ACR Breast Density Category d: The breast tissue is extremely dense,
which lowers the sensitivity of mammography.
FINDINGS: There are no findings suspicious for malignancy. Images were
processed with CAD.
IMPRESSION: No mammographic evidence of malignancy. A result letter of this
screening mammogram will be mailed directly to the patient.

RECOMMENDATION:
Screening mammogram in one year. (Code:BD-D-K0F)

BI-RADS CATEGORY  1: Negative.

## 2018-01-18 ENCOUNTER — Ambulatory Visit (HOSPITAL_COMMUNITY)
Admission: EM | Admit: 2018-01-18 | Discharge: 2018-01-18 | Disposition: A | Discharge status: Home / Self Care | Payer: BC Managed Care – PPO | Attending: Family Medicine | Admitting: Family Medicine

## 2018-01-18 ENCOUNTER — Encounter (HOSPITAL_COMMUNITY): Payer: Self-pay | Admitting: Emergency Medicine

## 2018-01-18 DIAGNOSIS — Z87410 Personal history of cervical dysplasia: Secondary | ICD-10-CM | POA: Diagnosis not present

## 2018-01-18 DIAGNOSIS — Z880 Allergy status to penicillin: Secondary | ICD-10-CM | POA: Insufficient documentation

## 2018-01-18 DIAGNOSIS — R8290 Unspecified abnormal findings in urine: Secondary | ICD-10-CM

## 2018-01-18 DIAGNOSIS — Z888 Allergy status to other drugs, medicaments and biological substances status: Secondary | ICD-10-CM | POA: Insufficient documentation

## 2018-01-18 DIAGNOSIS — Z3202 Encounter for pregnancy test, result negative: Secondary | ICD-10-CM

## 2018-01-18 DIAGNOSIS — R3989 Other symptoms and signs involving the genitourinary system: Secondary | ICD-10-CM | POA: Diagnosis present

## 2018-01-18 DIAGNOSIS — Z8619 Personal history of other infectious and parasitic diseases: Secondary | ICD-10-CM | POA: Insufficient documentation

## 2018-01-18 DIAGNOSIS — Z8249 Family history of ischemic heart disease and other diseases of the circulatory system: Secondary | ICD-10-CM | POA: Diagnosis not present

## 2018-01-18 DIAGNOSIS — Z823 Family history of stroke: Secondary | ICD-10-CM | POA: Insufficient documentation

## 2018-01-18 DIAGNOSIS — N12 Tubulo-interstitial nephritis, not specified as acute or chronic: Secondary | ICD-10-CM | POA: Diagnosis not present

## 2018-01-18 DIAGNOSIS — Z881 Allergy status to other antibiotic agents status: Secondary | ICD-10-CM | POA: Insufficient documentation

## 2018-01-18 LAB — POCT PREGNANCY, URINE: PREG TEST UR: NEGATIVE

## 2018-01-18 MED ORDER — STERILE WATER FOR INJECTION IJ SOLN
INTRAMUSCULAR | Status: AC
  Filled 2018-01-18: qty 10

## 2018-01-18 MED ORDER — IBUPROFEN 800 MG PO TABS
800.0000 mg | ORAL_TABLET | Freq: Once | ORAL | Status: AC
  Administered 2018-01-18: 800 mg via ORAL

## 2018-01-18 MED ORDER — CEFTRIAXONE SODIUM 1 G IJ SOLR
1.0000 g | Freq: Once | INTRAMUSCULAR | Status: AC
  Administered 2018-01-18: 1 g via INTRAMUSCULAR

## 2018-01-18 MED ORDER — IBUPROFEN 800 MG PO TABS
ORAL_TABLET | ORAL | Status: AC
  Filled 2018-01-18: qty 1

## 2018-01-18 MED ORDER — LIDOCAINE HCL (PF) 1 % IJ SOLN
INTRAMUSCULAR | Status: AC
  Filled 2018-01-18: qty 2

## 2018-01-18 MED ORDER — CEFTRIAXONE SODIUM 1 G IJ SOLR
INTRAMUSCULAR | Status: AC
  Filled 2018-01-18: qty 10

## 2018-01-18 MED ORDER — SULFAMETHOXAZOLE-TRIMETHOPRIM 800-160 MG PO TABS: 1.0000 | ORAL_TABLET | Freq: Two times a day (BID) | ORAL | 0 refills | Status: AC

## 2018-01-18 NOTE — Discharge Instructions (Signed)
Push fluids, primarily water, empty bladder regularly. Tylenol and/or ibuprofen as needed for pain or fevers.  If worsening of pain, fevers, blood in urine, difficulty urinating, no improvement in the next 48 hours, or otherwise worsening please return to be seen or go to Er.

## 2018-01-18 NOTE — ED Triage Notes (Signed)
PT C/O: UTI sx onset yest associated w/chills, body aches, headaches, foul urine smell and cloudy   DENIES: dysuria, urinary freq/urgency, hematuria, fevers  TAKING MEDS: none   A&O x4... NAD... Ambulatory

## 2018-01-18 NOTE — ED Provider Notes (Signed)
[facility]    CSN: [id number] Arrival date & time: 01/18/18  1157     History   Chief Complaint Chief Complaint  Patient presents with  . Urinary Tract Infection    HPI Yolanda Higgins is a 47 y.o. female.   [name] presents with complaints of body aches, left back pain, chills, cloudy and odorous urine, which started yesterday. States feels similar to a urinary tract infection she has had in the past. Took ibuprofen last night but has not taken since. Denies blood in urine, vaginal discharge or itching. Without URI symptoms. No known ill contacts. History of std's, ectopic pregnancy, ovarian cyst    ROS per HPI.       Past Medical History:  Diagnosis Date  . Chlamydia   . Dysmenorrhea   . Dysplasia of cervix, low grade (CIN 1)   . Ectopic pregnancy   . Gonorrhea   . HSV-2 infection   . Ovarian cyst   . Trichimoniasis     Patient Active Problem List   Diagnosis Date Noted  . Dense breasts 12/06/2012  . Hx of ectopic pregnancy 10/27/2010    Past Surgical History:  Procedure Laterality Date  . TUBAL LIGATION    . WISDOM TOOTH EXTRACTION      OB History    Gravida Para Term Preterm AB Living   5 2       2    SAB TAB Ectopic Multiple Live Births                   Home Medications    Prior to Admission medications   Medication Sig Start Date End Date Taking? Authorizing Provider  sulfamethoxazole-trimethoprim (BACTRIM DS,SEPTRA DS) 800-160 MG tablet Take 1 tablet by mouth 2 (two) times daily for 14 days. 01/18/18 02/01/18  Georgetta HaberBurky, Mayline Dragon B, NP  valACYclovir (VALTREX) 500 MG tablet Take 1 tablet (500 mg total) by mouth 2 (two) times daily. 09/26/12   Haygood, Maris BergerVanessa P, MD    Family History Family History  Problem Relation Age of Onset  . Cancer Mother   . Hypertension Father   . Stroke Father     Social History Social History   Tobacco Use  . Smoking status: Never Smoker  . Smokeless tobacco: Never Used  Substance Use Topics    . Alcohol use: No  . Drug use: No     Allergies   Ciprofloxacin; Penicillins; and Sudafed [pseudoephedrine hcl]   Review of Systems Review of Systems   Physical Exam Triage Vital Signs ED Triage Vitals  Enc Vitals Group     BP 01/18/18 1244 (!) 108/47     Pulse Rate 01/18/18 1244 (!) 113     Resp 01/18/18 1244 16     Temp 01/18/18 1244 (!) 101.2 F (38.4 C)     Temp Source 01/18/18 1244 Oral     SpO2 01/18/18 1244 100 %     Weight --      Height --      Head Circumference --      Peak Flow --      Pain Score 01/18/18 1246 6     Pain Loc --      Pain Edu? --      Excl. in GC? --    No data found.  Updated Vital Signs BP (!) 108/47 (BP Location: Left Arm)   Pulse (!) 113   Temp (!) 101.2 F (38.4 C) (Oral)   Resp 16  LMP 01/11/2018   SpO2 100%   Visual Acuity Right Eye Distance:   Left Eye Distance:   Bilateral Distance:    Right Eye Near:   Left Eye Near:    Bilateral Near:     Physical Exam  Constitutional: She is oriented to person, place, and time. She appears well-developed and well-nourished. No distress.  Cardiovascular: Regular rhythm and normal heart sounds. Tachycardia present.  Pulmonary/Chest: Effort normal and breath sounds normal.  Abdominal: Soft. There is no tenderness. There is CVA tenderness.  Positive left CVA tenderness  Neurological: She is alert and oriented to person, place, and time.  Skin: Skin is warm and dry.   Urine dip manual read:  Large leuks Positive nitrite 100+ protein PH 5.0 Large blood Specific gravity 1.030 Ketones 160+ Bili large Glucose neg   UC Treatments / Results  Labs (all labs ordered are listed, but only abnormal results are displayed) Labs Reviewed  URINE CULTURE  POCT PREGNANCY, URINE  URINE CYTOLOGY ANCILLARY ONLY    EKG  EKG Interpretation None       Radiology No results found.  Procedures Procedures (including critical care time)  Medications Ordered in UC Medications   cefTRIAXone (ROCEPHIN) injection 1 g (1 g Intramuscular Given 01/18/18 1325)  ibuprofen (ADVIL,MOTRIN) tablet 800 mg (800 mg Oral Given 01/18/18 1325)     Initial Impression / Assessment and Plan / UC Course  I have reviewed the triage vital signs and the nursing notes.  Pertinent labs & imaging results that were available during my care of the patient were reviewed by me and considered in my medical decision making (see chart for details).     Urine sent for culture. Fever and tachycardia with cva tenderness, rocephin provided in clinic. Ibuprofen for pain and fevers. Course of bacrim. Patient taking PO without nausea or vomiting, encouraged increased fluid intake. Return precautions provided and emphasized. If symptoms worsen or do not improve in the next week to return to be seen or to follow up with PCP.  Patient verbalized understanding and agreeable to plan.  Felt well, agreeable to discharge and ambulatory out of clinic without difficulty.  Case discussed with supervising physician Dr. Tracie Harrier  Final Clinical Impressions(s) / UC Diagnoses   Final diagnoses:  Pyelonephritis    ED Discharge Orders        Ordered    sulfamethoxazole-trimethoprim (BACTRIM DS,SEPTRA DS) 800-160 MG tablet  2 times daily     01/18/18 1311       Controlled Substance Prescriptions [place] Controlled Substance Registry consulted? Not Applicable   Georgetta Haber, NP 01/18/18 1358    Georgetta Haber, NP 01/18/18 [phone]

## 2018-01-18 NOTE — ED Notes (Signed)
Pt moved to room 10 for 20-30 min of observation after her Rocephin Injection.  Pt was instructed to let us know if she has any problems and we will also be checking on her for any signs of allergic reaction.

## 2018-01-18 NOTE — ED Notes (Signed)
Urinalysis performed, levels exceeded the limits of the clintex analyzer. Reported values of urine strip to Northwest Airlinesatalie Burky.

## 2018-01-19 ENCOUNTER — Telehealth (HOSPITAL_COMMUNITY): Payer: Self-pay | Admitting: Family Medicine

## 2018-01-19 LAB — URINE CYTOLOGY ANCILLARY ONLY
Chlamydia: NEGATIVE
NEISSERIA GONORRHEA: NEGATIVE
TRICH (WINDOWPATH): NEGATIVE

## 2018-01-19 MED ORDER — ONDANSETRON HCL 4 MG PO TABS: 4.0000 mg | ORAL_TABLET | Freq: Three times a day (TID) | ORAL | 0 refills | Status: DC | PRN

## 2018-01-19 MED ORDER — ONDANSETRON HCL 4 MG PO TABS: 4.0000 mg | ORAL_TABLET | Freq: Three times a day (TID) | ORAL | 0 refills | Status: AC | PRN

## 2018-01-19 NOTE — Telephone Encounter (Signed)
Spoke with Dr. Tracie HarrierHagler and Dr. Elbert EwingsL. Not many choices to change abx due to allergy list. Will try Zofran for N,V to take before abx and see if that helps. If not she needs to go to the ER. Pt understands.

## 2018-01-20 LAB — URINE CULTURE: Culture: >=100000 — AB

## 2018-01-23 LAB — URINE CYTOLOGY ANCILLARY ONLY: CANDIDA VAGINITIS: NEGATIVE

## 2019-06-11 ENCOUNTER — Encounter (HOSPITAL_COMMUNITY): Payer: Self-pay

## 2019-06-11 ENCOUNTER — Other Ambulatory Visit: Payer: Self-pay

## 2019-06-11 ENCOUNTER — Ambulatory Visit (HOSPITAL_COMMUNITY)
Admission: EM | Admit: 2019-06-11 | Discharge: 2019-06-11 | Disposition: A | Discharge status: Home / Self Care | Payer: BC Managed Care – PPO | Attending: Family Medicine | Admitting: Family Medicine

## 2019-06-11 DIAGNOSIS — N390 Urinary tract infection, site not specified: Secondary | ICD-10-CM | POA: Diagnosis not present

## 2019-06-11 LAB — POCT URINALYSIS DIP (DEVICE)
Bilirubin Urine: NEGATIVE
Glucose, UA: NEGATIVE mg/dL
Ketones, ur: NEGATIVE mg/dL
Nitrite: POSITIVE — AB
Protein, ur: NEGATIVE mg/dL
Specific Gravity, Urine: 1.025 (ref 1.005–1.030)
Urobilinogen, UA: 0.2 mg/dL (ref 0.0–1.0)
pH: 7 (ref 5.0–8.0)

## 2019-06-11 MED ORDER — ONDANSETRON 4 MG PO TBDP: 4.0000 mg | ORAL_TABLET | Freq: Three times a day (TID) | ORAL | 0 refills | Status: AC | PRN

## 2019-06-11 MED ORDER — CEPHALEXIN 500 MG PO CAPS: 500.0000 mg | ORAL_CAPSULE | Freq: Four times a day (QID) | ORAL | 0 refills | Status: AC

## 2019-06-11 NOTE — Discharge Instructions (Signed)
Return if any problems.

## 2019-06-11 NOTE — ED Triage Notes (Signed)
Patient presents to Urgent Care with complaints of lower back pain and burning with urination since yesterday. Patient reports she has has UTIs in the past and this feels the same.

## 2019-06-11 NOTE — ED Provider Notes (Signed)
[place]    CSN: [id number] Arrival date & time: 06/11/19  6387      History   Chief Complaint Chief Complaint  Patient presents with  . Urinary Tract Infection    HPI Yolanda Higgins is a 48 y.o. female.   The history is provided by the patient. No language interpreter was used.  Urinary Tract Infection Pain quality:  Aching Pain severity:  Moderate Onset quality:  Gradual Duration:  2 days Timing:  Constant Progression:  Worsening Chronicity:  New Recent urinary tract infections: no   Relieved by:  Nothing Worsened by:  Nothing Ineffective treatments:  None tried Urinary symptoms: frequent urination     Past Medical History:  Diagnosis Date  . Chlamydia   . Dysmenorrhea   . Dysplasia of cervix, low grade (CIN 1)   . Ectopic pregnancy   . Gonorrhea   . HSV-2 infection   . Ovarian cyst   . Trichimoniasis     Patient Active Problem List   Diagnosis Date Noted  . Dense breasts 12/06/2012  . Hx of ectopic pregnancy 10/27/2010    Past Surgical History:  Procedure Laterality Date  . TUBAL LIGATION    . WISDOM TOOTH EXTRACTION      OB History    Gravida  5   Para  2   Term      Preterm      AB      Living  2     SAB      TAB      Ectopic      Multiple      Live Births               Home Medications    Prior to Admission medications   Medication Sig Start Date End Date Taking? Authorizing Provider  cephALEXin (KEFLEX) 500 MG capsule Take 1 capsule (500 mg total) by mouth 4 (four) times daily. 06/11/19   Fransico Meadow, PA-C  ondansetron (ZOFRAN ODT) 4 MG disintegrating tablet Take 1 tablet (4 mg total) by mouth every 8 (eight) hours as needed for nausea or vomiting. 06/11/19   Fransico Meadow, PA-C  ondansetron (ZOFRAN) 4 MG tablet Take 1 tablet (4 mg total) by mouth every 8 (eight) hours as needed for nausea or vomiting. 01/19/18   Vanessa Kick, MD  valACYclovir (VALTREX) 500 MG tablet Take 1 tablet (500 mg  total) by mouth 2 (two) times daily. 09/26/12   Haygood, Seymour Bars, MD    Family History Family History  Problem Relation Age of Onset  . Cancer Mother   . Hypertension Father   . Stroke Father     Social History Social History   Tobacco Use  . Smoking status: Never Smoker  . Smokeless tobacco: Never Used  Substance Use Topics  . Alcohol use: No  . Drug use: No     Allergies   Ciprofloxacin, Penicillins, and Sudafed [pseudoephedrine hcl]   Review of Systems Review of Systems  All other systems reviewed and are negative.    Physical Exam Triage Vital Signs ED Triage Vitals  Enc Vitals Group     BP 06/11/19 0950 107/66     Pulse Rate 06/11/19 0950 (!) 113     Resp 06/11/19 0950 18     Temp 06/11/19 0950 (!) 100.5 F (38.1 C)     Temp Source 06/11/19 0950 Oral     SpO2 06/11/19 0950 98 %     Weight --  Height --      Head Circumference --      Peak Flow --      Pain Score 06/11/19 0948 0     Pain Loc --      Pain Edu? --      Excl. in GC? --    No data found.  Updated Vital Signs BP 107/66 (BP Location: Left Arm)   Pulse (!) 113   Temp (!) 100.5 F (38.1 C) (Oral)   Resp 18   SpO2 98%   Visual Acuity Right Eye Distance:   Left Eye Distance:   Bilateral Distance:    Right Eye Near:   Left Eye Near:    Bilateral Near:     Physical Exam Vitals signs and nursing note reviewed.  Constitutional:      Appearance: She is well-developed.  HENT:     Head: Normocephalic.     Nose: Nose normal.     Mouth/Throat:     Mouth: Mucous membranes are moist.  Eyes:     Pupils: Pupils are equal, round, and reactive to light.  Neck:     Musculoskeletal: Normal range of motion.  Cardiovascular:     Rate and Rhythm: Normal rate.  Pulmonary:     Effort: Pulmonary effort is normal.  Abdominal:     General: Abdomen is flat. There is no distension.  Musculoskeletal: Normal range of motion.  Skin:    General: Skin is warm.  Neurological:     Mental  Status: She is alert and oriented to person, place, and time.      UC Treatments / Results  Labs (all labs ordered are listed, but only abnormal results are displayed) Labs Reviewed  POCT URINALYSIS DIP (DEVICE) - Abnormal; Notable for the following components:      Result Value   Hgb urine dipstick MODERATE (*)    Nitrite POSITIVE (*)    Leukocytes,Ua TRACE (*)    All other components within normal limits    EKG None  Radiology No results found.  Procedures Procedures (including critical care time)  Medications Ordered in UC Medications - No data to display  Initial Impression / Assessment and Plan / UC Course  I have reviewed the triage vital signs and the nursing notes.  Pertinent labs & imaging results that were available during my care of the patient were reviewed by me and considered in my medical decision making (see chart for details).     MDM Pt counseled on uti and treatment Final Clinical Impressions(s) / UC Diagnoses   Final diagnoses:  Urinary tract infection with hematuria, site unspecified     Discharge Instructions     Return if any problems.    ED Prescriptions    Medication Sig Dispense Auth. Provider   cephALEXin (KEFLEX) 500 MG capsule Take 1 capsule (500 mg total) by mouth 4 (four) times daily. 40 capsule ,  K, PA-C   ondansetron (ZOFRAN ODT) 4 MG disintegrating tablet Take 1 tablet (4 mg total) by mouth every 8 (eight) hours as needed for nausea or vomiting. 20 tablet Elson Areas,  K, [place]-C     Controlled Substance Prescriptions [place] Controlled Substance Registry consulted? Not Applicable  An After Visit Summary was printed and given to the patient.    Elson Areas,  K, [place] 06/11/19 1435

## 2020-03-03 ENCOUNTER — Encounter: Payer: Self-pay | Admitting: Family Medicine

## 2020-03-03 ENCOUNTER — Ambulatory Visit: Payer: BC Managed Care – PPO | Admitting: Family Medicine

## 2020-03-03 ENCOUNTER — Ambulatory Visit: Payer: Self-pay

## 2020-03-03 ENCOUNTER — Other Ambulatory Visit: Payer: Self-pay

## 2020-03-03 VITALS — BP 104/72 | HR 62 | Ht 64.0 in | Wt 120.0 lb

## 2020-03-03 DIAGNOSIS — G8929 Other chronic pain: Secondary | ICD-10-CM | POA: Diagnosis not present

## 2020-03-03 DIAGNOSIS — G2589 Other specified extrapyramidal and movement disorders: Secondary | ICD-10-CM | POA: Diagnosis not present

## 2020-03-03 DIAGNOSIS — M25542 Pain in joints of left hand: Secondary | ICD-10-CM | POA: Insufficient documentation

## 2020-03-03 DIAGNOSIS — M2550 Pain in unspecified joint: Secondary | ICD-10-CM | POA: Insufficient documentation

## 2020-03-03 DIAGNOSIS — M79645 Pain in left finger(s): Secondary | ICD-10-CM | POA: Diagnosis not present

## 2020-03-03 NOTE — Assessment & Plan Note (Signed)
Left thumb pain.  Discussed with patient about icing regimen, home exercise, which activities to do which was to avoid.  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  Possible even subluxation exam.  Discussed how to accomplish relocation if necessary.  Once patient is fine we will see patient again in 5 to 6 weeks.  Mild strengthening exercises given

## 2020-03-03 NOTE — Patient Instructions (Signed)
Exercises 3x a week See me again in 6 weeks, if ok see me when you need me

## 2020-03-03 NOTE — Assessment & Plan Note (Signed)
Scapular dyskinesis noted.  Discussed with patient posture and ergonomics throughout the day.  Patient will increase activity slowly when possible.  We discussed different ergonomic changes with work.  Follow-up again in 4 to 8 weeks

## 2020-03-03 NOTE — Progress Notes (Signed)
Yolanda Higgins Phone: [phone] Subjective:   Yolanda Higgins, am serving as a scribe for Dr. Hulan Saas. This visit occurred during the SARS-CoV-2 public health emergency.  Safety protocols were in place, including screening questions prior to the visit, additional usage of staff PPE, and extensive cleaning of exam room while observing appropriate contact time as indicated for disinfecting solutions.    I'm seeing this patient by the request  of:  Patient, Higgins Pcp Per  CC: Left thumb pain  [id number]  Yolanda Higgins is a 49 y.o. female coming in with complaint of left thumb pain for 2 weeks over the thenar eminence. Pain has subsided. 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 Does not recall mechanism of injury. Did have tingling into the wrist. Was unable to grip without pain.      Past Medical History:  Diagnosis Date  . Chlamydia   . Dysmenorrhea   . Dysplasia of cervix, low grade (CIN 1)   . Ectopic pregnancy   . Gonorrhea   . HSV-2 infection   . Ovarian cyst   . Trichimoniasis    Past Surgical History:  Procedure Laterality Date  . TUBAL LIGATION    . WISDOM TOOTH EXTRACTION     Social History   Socioeconomic History  . Marital status: Single    Spouse name: Not on file  . Number of children: Not on file  . Years of education: Not on file  . Highest education level: Not on file  Occupational History  . Not on file  Tobacco Use  . Smoking status: Never Smoker  . Smokeless tobacco: Never Used  Substance and Sexual Activity  . Alcohol use: Higgins  . Drug use: Higgins  . Sexual activity: Yes    Birth control/protection: Surgical    Comment: BTL  Other Topics Concern  . Not on file  Social History Narrative  . Not on file   Social Determinants of Health   Financial Resource Strain:   . Difficulty of Paying Living Expenses: Not on file  Food Insecurity:   .  Worried About Charity fundraiser in the Last Year: Not on file  . Ran Out of Food in the Last Year: Not on file  Transportation Needs:   . Lack of Transportation (Medical): Not on file  . Lack of Transportation (Non-Medical): Not on file  Physical Activity:   . Days of Exercise per Week: Not on file  . Minutes of Exercise per Session: Not on file  Stress:   . Feeling of Stress : Not on file  Social Connections:   . Frequency of Communication with Friends and Family: Not on file  . Frequency of Social Gatherings with Friends and Family: Not on file  . Attends Religious Services: Not on file  . Active Member of Clubs or Organizations: Not on file  . Attends Archivist Meetings: Not on file  . Marital Status: Not on file   Allergies  Allergen Reactions  . Ciprofloxacin   . Penicillins     Sts she can take Amox  . Sudafed [Pseudoephedrine Hcl]    Family History  Problem Relation Age of Onset  . Cancer Mother   . Hypertension Father   . Stroke Father          Current Outpatient Medications (Other):  .  cephALEXin (KEFLEX) 500 MG capsule, Take 1 capsule (500  mg total) by mouth 4 (four) times daily. .  ondansetron (ZOFRAN ODT) 4 MG disintegrating tablet, Take 1 tablet (4 mg total) by mouth every 8 (eight) hours as needed for nausea or vomiting. .  ondansetron (ZOFRAN) 4 MG tablet, Take 1 tablet (4 mg total) by mouth every 8 (eight) hours as needed for nausea or vomiting. .  valACYclovir (VALTREX) 500 MG tablet, Take 1 tablet (500 mg total) by mouth 2 (two) times daily.   Reviewed prior external information including notes and imaging from  primary care provider As well as notes that were available from care everywhere and other healthcare systems.  Past medical history, social, surgical and family history all reviewed in electronic medical record.  Higgins pertanent information unless stated regarding to the chief complaint.   Review of Systems:  Higgins headache, visual  changes, nausea, vomiting, diarrhea, constipation, dizziness, abdominal pain, skin rash, fevers, chills, night sweats, weight loss, swollen lymph nodes, body aches, joint swelling, chest pain, shortness of breath, mood changes. POSITIVE muscle aches  Objective  Blood pressure 104/72, pulse 62, height 5\' 4"  (1.626 m), weight 120 lb (54.4 kg), SpO2 99 %.   General: Higgins apparent distress alert and oriented x3 mood and affect normal, dressed appropriately.  HEENT: Pupils equal, extraocular movements intact  Respiratory: Patient's speak in full sentences and does not appear short of breath  Cardiovascular: Higgins lower extremity edema, non tender, Higgins erythema  Skin: Warm dry intact with Higgins signs of infection or rash on extremities or on axial skeleton.  Abdomen: Soft nontender  Neuro: Cranial nerves II through XII are intact, neurovascularly intact in all extremities with 2+ DTRs and 2+ pulses.  Lymph: Higgins lymphadenopathy of posterior or anterior cervical chain or axillae bilaterally.  Gait normal with good balance and coordination.  MSK:  tender with full range of motion and good stability and symmetric strength and tone of shoulders, elbows, hip, knee and ankles bilaterally.  Hypermobility noted. Left thumb exam has full range of motion and actually hypermobility noted.  Appears to be potentially double-jointed.  Higgins tenderness today.  Higgins inflammation.  Negative grind test.  UCL intact with the hypermobility do notice that patient has significant scapular dyskinesis noted of the right side.  Limited musculoskeletal ultrasound was performed and interpreted   ltd us/ states unremarkable overall.  Higgins CMC arthritis noted.  Higgins hypoechoic changes noted. Impression: Normal   Impression and Recommendations:     This case required medical decision making of moderate complexity. The above documentation has been reviewed and is accurate and complete Judi Saa, DO       Note: This  dictation was prepared with Dragon dictation along with smaller phrase technology. Any transcriptional errors that result from this process are unintentional.

## 2020-04-14 ENCOUNTER — Ambulatory Visit: Payer: BC Managed Care – PPO | Admitting: Family Medicine

## 2020-11-29 ENCOUNTER — Ambulatory Visit: Payer: BC Managed Care – PPO | Attending: Internal Medicine

## 2020-11-29 DIAGNOSIS — Z23 Encounter for immunization: Secondary | ICD-10-CM

## 2020-11-29 NOTE — Progress Notes (Signed)
   Covid-19 Vaccination Clinic  Name:  Yolanda Higgins    MRN: [id number] DOB: 05-27-1971  11/29/2020  [name] was observed post Covid-19 immunization for 30 minutes based on pre-vaccination screening without incident. She was provided with Vaccine Information Sheet and instruction to access the V-Safe system.   [name] was instructed to call 911 with any severe reactions post vaccine: Marland Kitchen Difficulty breathing  . Swelling of face and throat  . A fast heartbeat  . A bad rash all over body  . Dizziness and weakness   Immunizations Administered    Name Date Dose VIS Date Route   Pfizer COVID-19 Vaccine 11/29/2020 12:34 PM 0.3 mL 10/15/2020 Intramuscular   Manufacturer: [employer], [employer]   Lot: [id number]   NDC: [address]-7

## 2020-12-22 ENCOUNTER — Ambulatory Visit: Payer: BC Managed Care – PPO | Attending: Internal Medicine

## 2020-12-22 DIAGNOSIS — Z23 Encounter for immunization: Secondary | ICD-10-CM

## 2020-12-22 NOTE — Progress Notes (Signed)
   Covid-19 Vaccination Clinic  Name:  RONEISHA STERN    MRN: [id number] DOB: 1971-07-22  12/22/2020  Ms. Kohn was observed post Covid-19 immunization for 15 minutes without incident. She was provided with Vaccine Information Sheet and instruction to access the V-Safe system.   Ms. Frasco was instructed to call 911 with any severe reactions post vaccine: Marland Kitchen Difficulty breathing  . Swelling of face and throat  . A fast heartbeat  . A bad rash all over body  . Dizziness and weakness   Immunizations Administered    Name Date Dose VIS Date Route   Pfizer COVID-19 Vaccine 12/22/2020  1:37 PM 0.3 mL 10/15/2020 Intramuscular   Manufacturer: [employer], [employer]   Lot: [id number]   NDC: [address]-3

## 2022-04-08 ENCOUNTER — Ambulatory Visit
Admission: EM | Admit: 2022-04-08 | Discharge: 2022-04-08 | Disposition: A | Discharge status: Home / Self Care | Payer: BC Managed Care – PPO | Attending: Internal Medicine | Admitting: Internal Medicine

## 2022-04-08 ENCOUNTER — Encounter: Payer: Self-pay | Admitting: Emergency Medicine

## 2022-04-08 DIAGNOSIS — J069 Acute upper respiratory infection, unspecified: Secondary | ICD-10-CM

## 2022-04-08 MED ORDER — FLUTICASONE PROPIONATE 50 MCG/ACT NA SUSP: 1.0000 | Freq: Every day | NASAL | 0 refills | Status: AC

## 2022-04-08 NOTE — ED Triage Notes (Signed)
Patient c/o possible sinus infection, drainage and some cough x 1 day.  Patient has taken OTC Sudafed and Zyrtec. ?

## 2022-04-08 NOTE — Discharge Instructions (Signed)
It appears that you have a viral upper respiratory infection that should run its course and self resolve in the next few days.  A nasal spray has been prescribed for you to take.  Please follow-up if symptoms persist or worsen. ?

## 2022-04-08 NOTE — ED Provider Notes (Signed)
?[facility] ? ? ? ?CSN: [id number] ?Arrival date & time: 04/08/22  1615 ? ? ?  ? ?History   ?Chief Complaint ?Chief Complaint  ?Patient presents with  ? Possible Sinus Infection  ? ? ?HPI ?Yolanda Higgins is a 51 y.o. female.  ? ?Patient presents with nasal drainage, nasal congestion, cough that started yesterday.  Denies any known fevers or sick contacts.  Patient has taken Sudafed and Zyrtec with some improvement.  Denies chest pain, shortness of breath, sore throat, ear pain, nausea, vomiting, diarrhea, abdominal pain.  Denies history of asthma COPD and patient is not a smoker. ? ? ? ?Past Medical History:  ?Diagnosis Date  ? Chlamydia   ? Dysmenorrhea   ? Dysplasia of cervix, low grade (CIN 1)   ? Ectopic pregnancy   ? Gonorrhea   ? HSV-2 infection   ? Ovarian cyst   ? Trichimoniasis   ? ? ?Patient Active Problem List  ? Diagnosis Date Noted  ? Pain in thumb joint with movement of left hand 03/03/2020  ? 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 Dense breasts 12/06/2012  ? Hx of ectopic pregnancy 10/27/2010  ? ? ?Past Surgical History:  ?Procedure Laterality Date  ? TUBAL LIGATION    ? WISDOM TOOTH EXTRACTION    ? ? ?OB History   ? ? Gravida  ?5  ? Para  ?2  ? Term  ?   ? Preterm  ?   ? AB  ?   ? Living  ?2  ?  ? ? SAB  ?   ? IAB  ?   ? Ectopic  ?   ? Multiple  ?   ? Live Births  ?   ?   ?  ?  ? ? ? ?Home Medications   ? ?Prior to Admission medications   ?Medication Sig Start Date End Date Taking? Authorizing Provider  ?fluticasone (FLONASE) 50 MCG/ACT nasal spray Place 1 spray into both nostrils daily for 3 days. 04/08/22 04/11/22 Yes Gustavus BryantMound, Joshua Zeringue E, FNP  ?cephALEXin (KEFLEX) 500 MG capsule Take 1 capsule (500 mg total) by mouth 4 (four) times daily. 06/11/19   Elson AreasSofia, Leslie K, PA-C  ?ondansetron (ZOFRAN ODT) 4 MG disintegrating tablet Take 1 tablet (4 mg total) by mouth every 8 (eight) hours as needed for nausea or vomiting. 06/11/19   Elson AreasSofia, Leslie K, PA-C   ?ondansetron (ZOFRAN) 4 MG tablet Take 1 tablet (4 mg total) by mouth every 8 (eight) hours as needed for nausea or vomiting. 01/19/18   Mardella LaymanHagler, Brian, MD  ?valACYclovir (VALTREX) 500 MG tablet Take 1 tablet (500 mg total) by mouth 2 (two) times daily. 09/26/12   Haygood, Maris BergerVanessa P, MD  ? ? ?Family History ?Family History  ?Problem Relation Age of Onset  ? Cancer Mother   ? Hypertension Father   ? Stroke Father   ? ? ?Social History ?Social History  ? ?Tobacco Use  ? Smoking status: Never  ? Smokeless tobacco: Never  ?Vaping Use  ? Vaping Use: Never used  ?Substance Use Topics  ? Alcohol use: No  ? Drug use: No  ? ? ? ?Allergies   ?Ciprofloxacin, Penicillins, and Sudafed [pseudoephedrine hcl] ? ? ?Review of Systems ?Review of Systems ?Per HPI ? ?Physical Exam ?Triage Vital Signs ?ED Triage Vitals  ?Enc Vitals Group  ?   BP 04/08/22 1633 103/63  ?   Pulse Rate 04/08/22 1633 91  ?   Resp 04/08/22  1633 18  ?   Temp 04/08/22 1633 98.1 ?F (36.7 ?C)  ?   Temp Source 04/08/22 1633 Oral  ?   SpO2 04/08/22 1633 96 %  ?   Weight 04/08/22 1634 133 lb (60.3 kg)  ?   Height 04/08/22 1634 5\' 4"  (1.626 m)  ?   Head Circumference --   ?   Peak Flow --   ?   Pain Score 04/08/22 1634 0  ?   Pain Loc --   ?   Pain Edu? --   ?   Excl. in GC? --   ? ?No data found. ? ?Updated Vital Signs ?BP 103/63 (BP Location: Left Arm)   Pulse 91   Temp 98.1 ?F (36.7 ?C) (Oral)   Resp 18   Ht 5\' 4"  (1.626 m)   Wt 133 lb (60.3 kg)   LMP 01/11/2018   SpO2 96%   BMI 22.83 kg/m?  ? ?Visual Acuity ?Right Eye Distance:   ?Left Eye Distance:   ?Bilateral Distance:   ? ?Right Eye Near:   ?Left Eye Near:    ?Bilateral Near:    ? ?Physical Exam ?Constitutional:   ?   General: She is not in acute distress. ?   Appearance: Normal appearance. She is not toxic-appearing or diaphoretic.  ?HENT:  ?   Head: Normocephalic and atraumatic.  ?   Right Ear: Tympanic membrane and ear canal normal.  ?   Left Ear: Tympanic membrane and ear canal normal.  ?   Nose:  Congestion present.  ?   Mouth/Throat:  ?   Mouth: Mucous membranes are moist.  ?   Pharynx: No posterior oropharyngeal erythema.  ?Eyes:  ?   Extraocular Movements: Extraocular movements intact.  ?   Conjunctiva/sclera: Conjunctivae normal.  ?   Pupils: Pupils are equal, round, and reactive to light.  ?Cardiovascular:  ?   Rate and Rhythm: Normal rate and regular rhythm.  ?   Pulses: Normal pulses.  ?   Heart sounds: Normal heart sounds.  ?Pulmonary:  ?   Effort: Pulmonary effort is normal. No respiratory distress.  ?   Breath sounds: Normal breath sounds. No stridor. No wheezing, rhonchi or rales.  ?Abdominal:  ?   General: Abdomen is flat. Bowel sounds are normal.  ?   Palpations: Abdomen is soft.  ?Musculoskeletal:     ?   General: Normal range of motion.  ?   Cervical back: Normal range of motion.  ?Skin: ?   General: Skin is warm and dry.  ?Neurological:  ?   General: No focal deficit present.  ?   Mental Status: She is alert and oriented to person, place, and time. Mental status is at baseline.  ?Psychiatric:     ?   Mood and Affect: Mood normal.     ?   Behavior: Behavior normal.  ? ? ? ?UC Treatments / Results  ?Labs ?(all labs ordered are listed, but only abnormal results are displayed) ?Labs Reviewed  ?NOVEL CORONAVIRUS, NAA  ? ? ?EKG ? ? ?Radiology ?No results found. ? ?Procedures ?Procedures (including critical care time) ? ?Medications Ordered in UC ?Medications - No data to display ? ?Initial Impression / Assessment and Plan / UC Course  ?I have reviewed the triage vital signs and the nursing notes. ? ?Pertinent labs & imaging results that were available during my care of the patient were reviewed by me and considered in my medical decision making (see chart for details). ? ?  ? ?  Patient presents with symptoms likely from a viral upper respiratory infection. Differential includes bacterial pneumonia, sinusitis, allergic rhinitis, COVID-19. Do not suspect underlying cardiopulmonary process. Symptoms  seem unlikely related to ACS, CHF or COPD exacerbations, pneumonia, pneumothorax. Patient is nontoxic appearing and not in need of emergent medical intervention.  COVID test pending. ? ?Recommended symptom control with over the counter medications.  Flonase sent for patient. ? ?Return if symptoms fail to improve in 1-2 weeks or you develop shortness of breath, chest pain, severe headache. Patient states understanding and is agreeable. ? ?Discharged with PCP followup.  ?Final Clinical Impressions(s) / UC Diagnoses  ? ?Final diagnoses:  ?Viral upper respiratory tract infection with cough  ? ? ? ?Discharge Instructions   ? ?  ?It appears that you have a viral upper respiratory infection that should run its course and self resolve in the next few days.  A nasal spray has been prescribed for you to take.  Please follow-up if symptoms persist or worsen. ? ? ? ?ED Prescriptions   ? ? Medication Sig Dispense Auth. Provider  ? fluticasone (FLONASE) 50 MCG/ACT nasal spray Place 1 spray into both nostrils daily for 3 days. 16 g Gustavus Bryant, [place]  ? ?  ? ?PDMP not reviewed this encounter. ?  ?Gustavus Bryant, [place] ?04/08/22 1655 ? ?

## 2022-04-09 LAB — NOVEL CORONAVIRUS, NAA: SARS-CoV-2, NAA: NOT DETECTED

## 2022-05-25 ENCOUNTER — Other Ambulatory Visit: Payer: Self-pay

## 2022-05-25 ENCOUNTER — Encounter: Payer: Self-pay | Admitting: Emergency Medicine

## 2022-05-25 ENCOUNTER — Ambulatory Visit
Admission: EM | Admit: 2022-05-25 | Discharge: 2022-05-25 | Disposition: A | Discharge status: Home / Self Care | Payer: BC Managed Care – PPO | Attending: Internal Medicine | Admitting: Internal Medicine

## 2022-05-25 DIAGNOSIS — R21 Rash and other nonspecific skin eruption: Secondary | ICD-10-CM | POA: Diagnosis not present

## 2022-05-25 DIAGNOSIS — L239 Allergic contact dermatitis, unspecified cause: Secondary | ICD-10-CM

## 2022-05-25 MED ORDER — PREDNISONE 20 MG PO TABS: 40.0000 mg | ORAL_TABLET | Freq: Every day | ORAL | 0 refills | Status: AC

## 2022-05-25 NOTE — ED Provider Notes (Signed)
[facility]    CSN: [id number] Arrival date & time: 05/25/22  1920      History   Chief Complaint Chief Complaint  Patient presents with   Rash    HPI Yolanda Higgins is a 51 y.o. female.   Patient presents with rash to bilateral arms, back, stomach, chest that has been present for approximately 4 days.  Rash is itchy.  Denies any changes in the environment including lotions, soaps, foods, detergents, etc.  Denies any associated fever or upper respiratory symptoms.  Patient has taken Benadryl with minimal improvement.   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 Authorizing Provider  predniSONE (DELTASONE) 20 MG tablet Take 2 tablets (40 mg total) by mouth daily for 5 days. 05/25/22 05/30/22 Yes Joriel Streety, Acie Fredrickson, FNP  cephALEXin (KEFLEX) 500 MG capsule Take 1 capsule (500 mg total) by mouth 4 (four) times daily. Patient not taking: Reported on 05/25/2022 06/11/19   Elson Areas, PA-C  fluticasone [facility]) 50 MCG/ACT nasal spray Place 1 spray into both nostrils daily for 3 days. 04/08/22 04/11/22  Gustavus Bryant, FNP  ondansetron (ZOFRAN ODT) 4 MG disintegrating tablet  Take 1 tablet (4 mg total) by mouth every 8 (eight) hours as needed for nausea or vomiting. 06/11/19   Elson Areas, PA-C  ondansetron (ZOFRAN) 4 MG tablet Take 1 tablet (4 mg total) by mouth every 8 (eight) hours as needed for nausea or vomiting. 01/19/18   Mardella Layman, MD  valACYclovir (VALTREX) 500 MG tablet Take 1 tablet (500 mg total) by mouth 2 (two) times daily. 09/26/12   Haygood, Maris Berger, MD    Family History Family History  Problem Relation Age of Onset   Cancer Mother    Hypertension Father    Stroke Father     Social History Social History   Tobacco Use   Smoking status: Never   Smokeless tobacco: Never  Vaping Use   Vaping Use: Never used  Substance Use Topics   Alcohol use: No   Drug use: No     Allergies   Ciprofloxacin, Penicillins, and Sudafed [pseudoephedrine hcl]   Review of Systems Review of Systems Per HPI  Physical Exam Triage Vital Signs ED Triage Vitals [05/25/22 1942]  Enc Vitals Group     BP 119/76     Pulse Rate 70     Resp 18     Temp 97.7 F (36.5 C)     Temp Source Oral     SpO2 97 %     Weight      Height      Head Circumference      Peak Flow      Pain Score 0     Pain Loc      Pain Edu?      Excl. in GC?    No data found.  Updated Vital Signs BP 119/76 (BP Location: Left Arm)   Pulse 70   Temp 97.7 F (36.5 C) (Oral)   Resp 18   LMP 01/11/2018   SpO2 97%   Visual Acuity Right Eye Distance:   Left Eye Distance:   Bilateral Distance:    Right Eye Near:   Left Eye Near:    Bilateral Near:     Physical Exam Constitutional:      General: She is not in acute distress.    Appearance: Normal appearance. She is not toxic-appearing or diaphoretic.  HENT:     Head: Normocephalic and atraumatic.  Eyes:     Extraocular Movements: Extraocular movements intact.     Conjunctiva/sclera: Conjunctivae normal.  Pulmonary:     Effort: Pulmonary effort is normal.  Skin:    Comments: Diffuse maculopapular rash  present throughout back, chest, stomach, bilateral upper extremities.  No purulent drainage noted.  No signs of bacterial infection.  Neurological:     General: No focal deficit present.     Mental Status: She is alert and oriented to person, place, and time. Mental status is at baseline.  Psychiatric:        Mood and Affect: Mood normal.        Behavior: Behavior normal.        Thought Content: Thought content normal.        Judgment: Judgment normal.     UC Treatments / Results  Labs (all labs ordered are listed, but only abnormal results are displayed) Labs Reviewed - No data to display  EKG   Radiology No results found.  Procedures Procedures (including critical care time)  Medications Ordered in UC Medications - No data to display  Initial Impression / Assessment and Plan / UC Course  I have reviewed the triage vital signs and the nursing notes.  Pertinent labs & imaging results that were available during my care of the patient were reviewed by me and considered in my medical decision making (see chart for details).     Rash is consistent with allergic reaction.  Will treat with prednisone steroid given symptoms have been refractory to antihistamines.  No signs of respiratory distress on exam.  Patient encouraged to follow-up if symptoms persist or worsen.  Patient verbalized understanding and was agreeable with plan. Final Clinical Impressions(s) / UC Diagnoses   Final diagnoses:  Rash and nonspecific skin eruption  Allergic contact dermatitis, unspecified trigger     Discharge Instructions      It appears that you are having an allergic reaction.  You are being treated with prednisone steroid.  Please follow-up if symptoms persist or worsen.    ED Prescriptions     Medication Sig Dispense Auth. Provider   predniSONE (DELTASONE) 20 MG tablet Take 2 tablets (40 mg total) by mouth daily for 5 days. 10 tablet [place], [place],  FNP      PDMP not reviewed  this encounter.   Gustavus Bryant, [place] 05/25/22 2017

## 2022-05-25 NOTE — Discharge Instructions (Signed)
It appears that you are having an allergic reaction.  You are being treated with prednisone steroid.  Please follow-up if symptoms persist or worsen.

## 2022-05-25 NOTE — ED Triage Notes (Signed)
Pt here for rash to arms and back and stomach x 4 days that is itchy

## 2024-01-09 ENCOUNTER — Other Ambulatory Visit: Payer: Self-pay | Admitting: Obstetrics and Gynecology

## 2024-01-09 DIAGNOSIS — Z1231 Encounter for screening mammogram for malignant neoplasm of breast: Secondary | ICD-10-CM
